# Patient Record
Sex: Female | Born: 1965 | Race: Black or African American | Hispanic: No | Marital: Married | Smoking: Current every day smoker
Health system: Southern US, Community
[De-identification: ages and names within clinical notes are randomized; demographics above are authoritative.]

## PROBLEM LIST (undated history)

## (undated) DIAGNOSIS — I10 Essential (primary) hypertension: Secondary | ICD-10-CM

---

## 2006-01-27 ENCOUNTER — Ambulatory Visit: Payer: Self-pay | Admitting: Unknown Physician Specialty

## 2006-05-14 ENCOUNTER — Emergency Department: Payer: Self-pay | Admitting: Emergency Medicine

## 2006-07-14 ENCOUNTER — Ambulatory Visit: Payer: Self-pay | Admitting: Unknown Physician Specialty

## 2006-07-26 ENCOUNTER — Inpatient Hospital Stay: Payer: Self-pay | Admitting: Unknown Physician Specialty

## 2006-08-03 ENCOUNTER — Inpatient Hospital Stay: Payer: Self-pay

## 2008-09-07 IMAGING — CT CT GUIDED ABCESS DRAINAGE WITH CATHETER
2 of 4 series · 13 of 32 positions shown, 18 images · non-contrast
Comparison: none

REASON FOR EXAM: Abscess
COMMENTS:   LMP: Post Hysterectomy

[Series 2: soft tissue · axial · 0.87mm/px · z∈[-1026,-874]mm · 7 of 27 slices shown, 12 images (1 of 2)]
[im 4/27  soft-tissue]
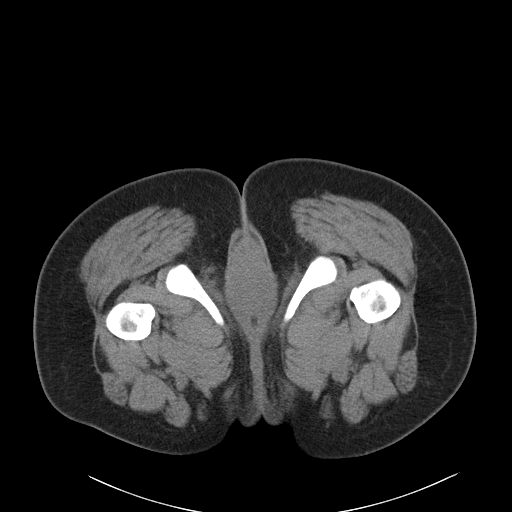
[im 4/27  bone]
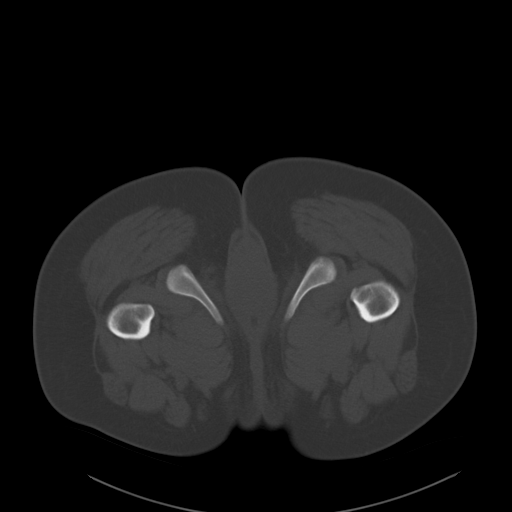
[im 7/27  soft-tissue]
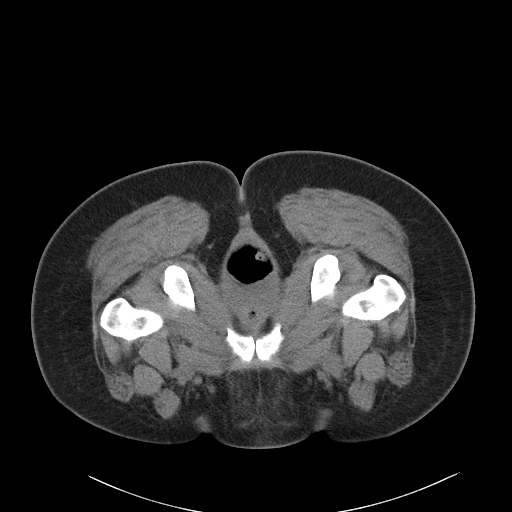
[im 10/27  soft-tissue]
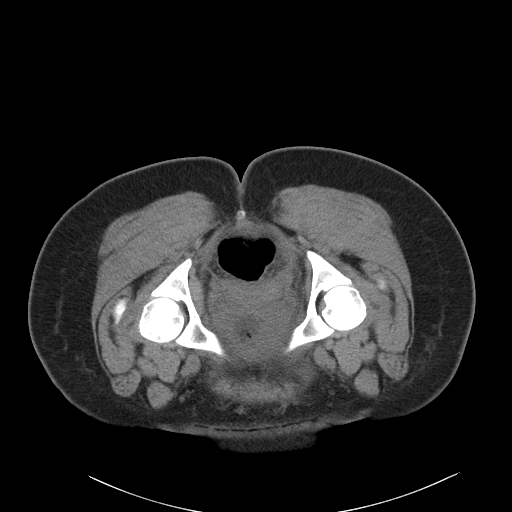
[im 14/27  soft-tissue]
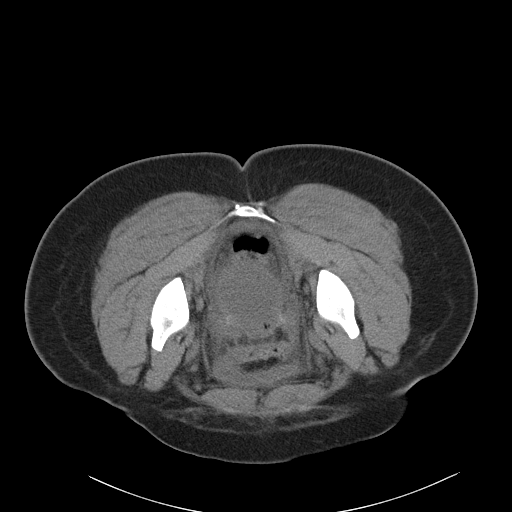
[im 14/27  lung]
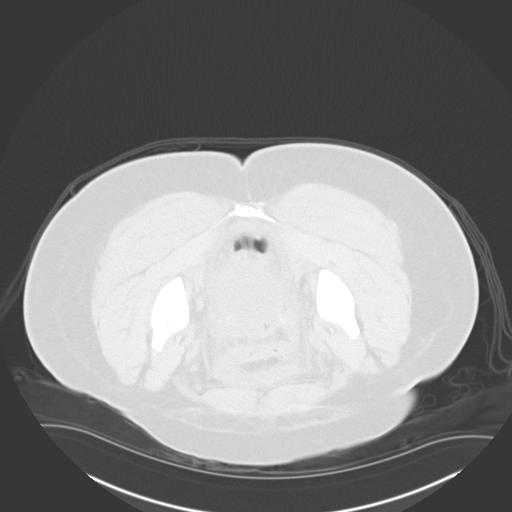
[im 17/27  soft-tissue]
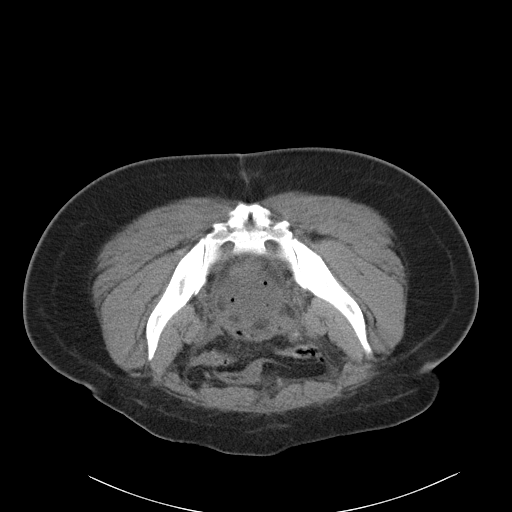
[im 17/27  lung]
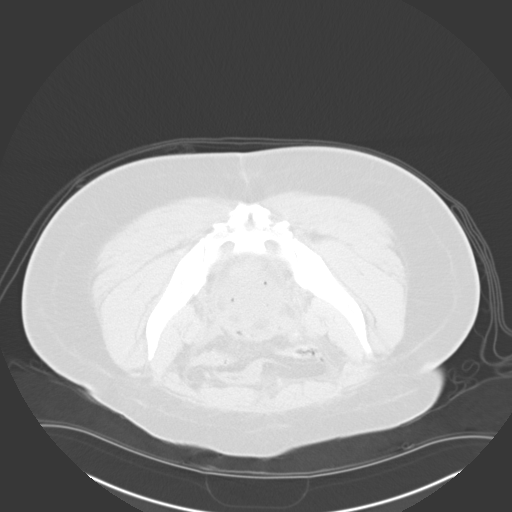
[im 20/27  soft-tissue]
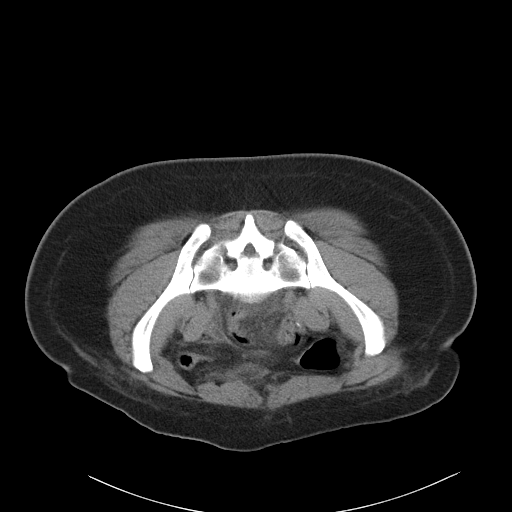
[im 20/27  lung]
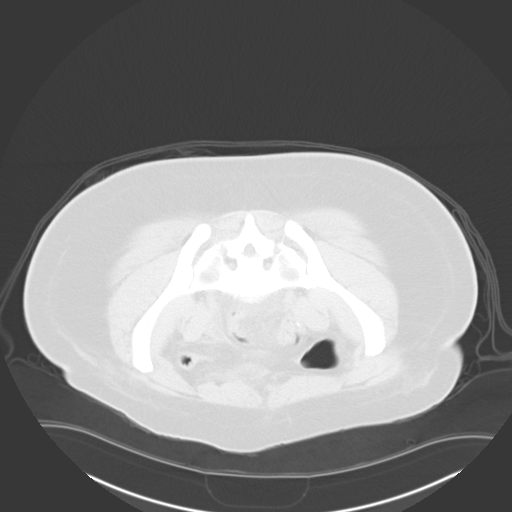
[im 23/27  soft-tissue]
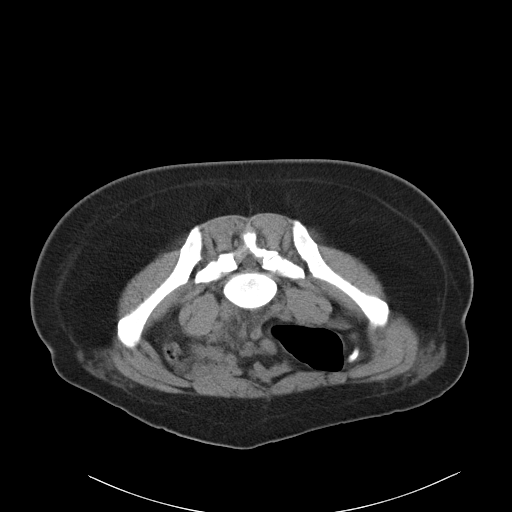
[im 23/27  lung]
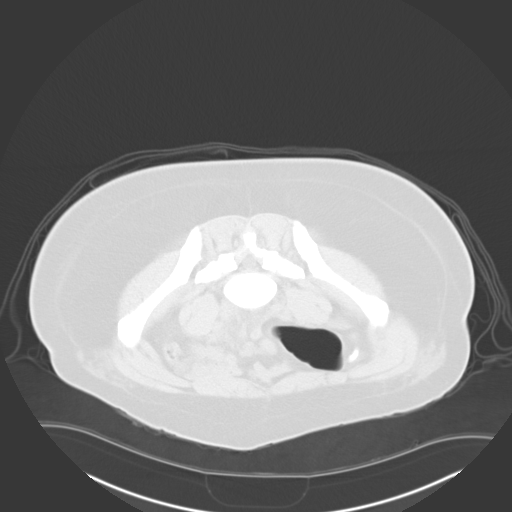

[Series 4: soft tissue · axial · 0.87mm/px · z∈[-1026,-874]mm · 6 of 27 slices shown (2 of 2)]
[im 4/27  soft-tissue]
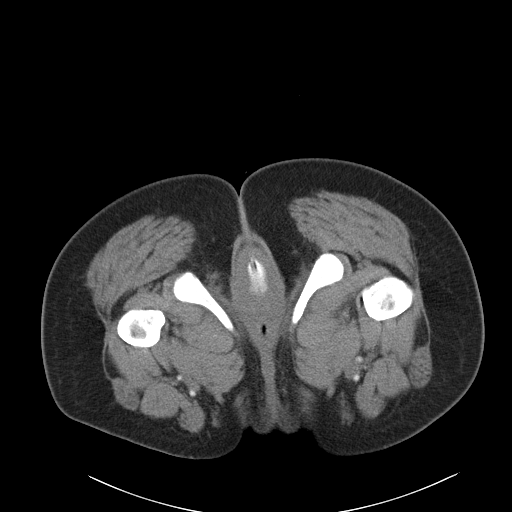
[im 8/27  soft-tissue]
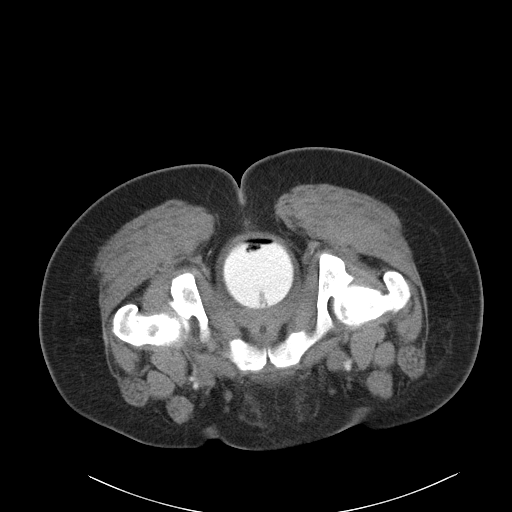
[im 12/27  soft-tissue]
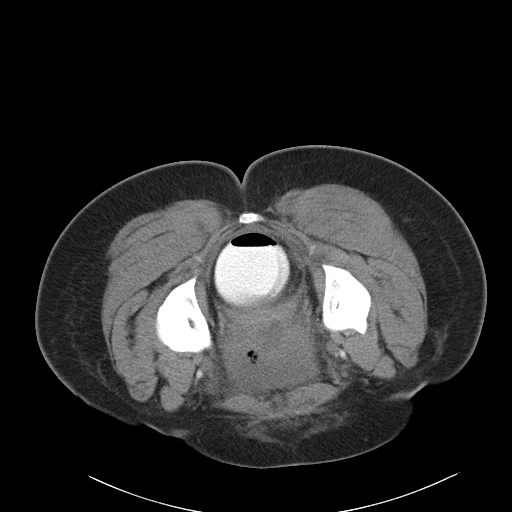
[im 15/27  soft-tissue]
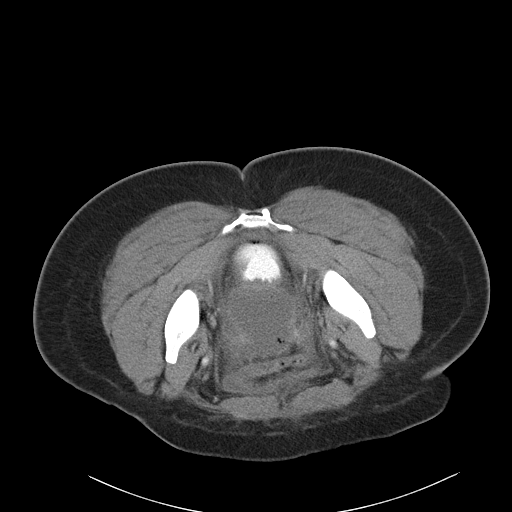
[im 19/27  soft-tissue]
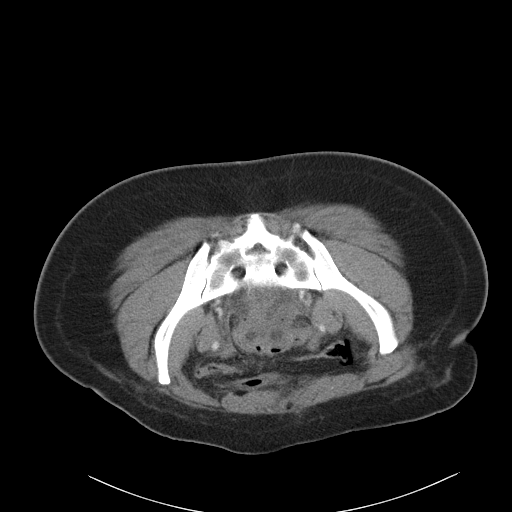
[im 23/27  soft-tissue]
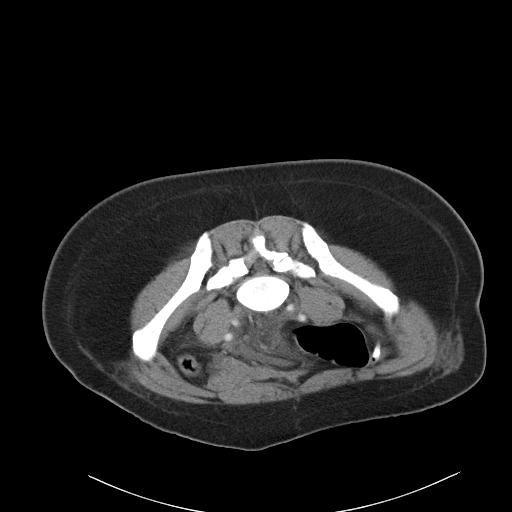

[13 of 32 positions shown; findings below may reference images not displayed]

PROCEDURE:     CT  - CT GUIDED ABSCESS DRAINAGE  - August 06, 2006  [DATE]

RESULT:     The patient was informed of the risks and benefits of the
procedure and proper informed consent was obtained. The patient was brought
to the CT suite and placed in a supine position. A fluid collection is
identified in the region of the central lower pelvis. Proper entry site for
CT guided cannulation and drainage was established along the LEFT gluteal
region. The overlying soft tissues were then prepped and draped in the usual
sterile fashion. Utilizing approximately 10 ml of 1% Lidocaine without
Epinephrine, the overlying soft tissues were anesthetized. The patient did
receive conscious sedation via titrated doses of 150 micrograms of Fentanyl
IV and 3 mg Versed IV. A 20.0 cm/20 gauge, Chiba Biopsy needle was used to
cannulate the fluid collection within the pelvis. Needle placement was
performed utilizing CT guidance. Once appropriate, needle placement was
performed and approximately 30 ml of bloody, serosanguineous fluid was
aspirated from the collection. These findings appear to reflect the sequelae
of an evolving/resolving hematoma. With this taken into consideration, the
findings were discussed with Dr. Brack Tiger of the OB/GYN Service and aspiration
without drainage tube placement appears to be a more prudent approach
considering the findings which appear to reflect a hematoma in evolution.
Once fluid could no longer be aspirated from the collection, the needle was
removed. Post aspiration evaluation demonstrated decreased size of the fluid
collection within the pelvis. The patient tolerated the procedure without
complications and was returned to previously administered orders per Dr.
Brack Tiger. A sample of the aspirated blood was sent to the laboratory for
culture and gram stain with results to be sent to Dr. Brack Tiger.
IMPRESSION: CT guided, fluid collection/aspiration as described above.
The patient tolerated the procedure without complications.

## 2010-07-23 ENCOUNTER — Ambulatory Visit: Payer: Self-pay | Admitting: Orthopedic Surgery

## 2010-07-27 ENCOUNTER — Emergency Department: Payer: Self-pay | Admitting: Emergency Medicine

## 2010-07-28 LAB — PATHOLOGY REPORT

## 2017-03-07 ENCOUNTER — Ambulatory Visit: Payer: Self-pay

## 2017-05-09 ENCOUNTER — Ambulatory Visit: Payer: Self-pay

## 2017-06-20 ENCOUNTER — Ambulatory Visit: Payer: Self-pay

## 2017-06-27 ENCOUNTER — Ambulatory Visit: Payer: Self-pay | Attending: Oncology

## 2017-12-31 ENCOUNTER — Emergency Department: Payer: Self-pay

## 2017-12-31 ENCOUNTER — Emergency Department
Admission: EM | Admit: 2017-12-31 | Discharge: 2017-12-31 | Disposition: A | Discharge status: Home / Self Care | Payer: Self-pay | Attending: Emergency Medicine | Admitting: Emergency Medicine

## 2017-12-31 ENCOUNTER — Encounter: Payer: Self-pay | Admitting: Emergency Medicine

## 2017-12-31 ENCOUNTER — Other Ambulatory Visit: Payer: Self-pay

## 2017-12-31 DIAGNOSIS — I10 Essential (primary) hypertension: Secondary | ICD-10-CM | POA: Insufficient documentation

## 2017-12-31 DIAGNOSIS — F17210 Nicotine dependence, cigarettes, uncomplicated: Secondary | ICD-10-CM | POA: Insufficient documentation

## 2017-12-31 DIAGNOSIS — J40 Bronchitis, not specified as acute or chronic: Secondary | ICD-10-CM | POA: Insufficient documentation

## 2017-12-31 HISTORY — DX: Essential (primary) hypertension: I10

## 2017-12-31 MED ORDER — BENZONATATE 100 MG PO CAPS: ORAL_CAPSULE | ORAL | 0 refills | Status: DC

## 2017-12-31 MED ORDER — IPRATROPIUM-ALBUTEROL 0.5-2.5 (3) MG/3ML IN SOLN
3.0000 mL | Freq: Once | RESPIRATORY_TRACT | Status: AC
  Administered 2017-12-31: 3 mL via RESPIRATORY_TRACT
  Filled 2017-12-31: qty 3

## 2017-12-31 MED ORDER — ALBUTEROL SULFATE HFA 108 (90 BASE) MCG/ACT IN AERS: 2.0000 | INHALATION_SPRAY | Freq: Four times a day (QID) | RESPIRATORY_TRACT | 0 refills | Status: DC | PRN

## 2017-12-31 MED ORDER — PREDNISONE 20 MG PO TABS
60.0000 mg | ORAL_TABLET | Freq: Once | ORAL | Status: AC
  Administered 2017-12-31: 60 mg via ORAL
  Filled 2017-12-31: qty 3

## 2017-12-31 MED ORDER — PREDNISONE 20 MG PO TABS: 20.0000 mg | ORAL_TABLET | Freq: Every day | ORAL | 0 refills | Status: AC

## 2017-12-31 NOTE — Discharge Instructions (Addendum)
Your exam and XR are negative for pneumonia. Take the prescription meds as directed. Drink plenty of fluids and rest as needed.

## 2017-12-31 NOTE — ED Notes (Signed)
Pt w/ complaints of cough that is productive with clear sputum. Provider at bedside and medication administered.

## 2017-12-31 NOTE — ED Notes (Signed)
Pt verbalized understanding of discharge instructions. NAD at this time. 

## 2017-12-31 NOTE — ED Triage Notes (Signed)
Pt to ed with c/o cough, congestion, sore throat x 2 weeks.

## 2018-01-01 NOTE — ED Provider Notes (Signed)
[facility] Emergency Department Provider Note ____________________________________________  Time seen: 1913  I have reviewed the triage vital signs and the nursing notes.  HISTORY  Chief Complaint  Cough  HPI Jessica Marsh is a 52 y.o. female who presents to the ED accompanied by her adult daughter, for evaluation of persistent cough and congestion for the last 2 weeks.  Patient was a current everyday smoker as well as hypertensive, reports that she has had a productive cough that has been worsened by exposure to cold air at work.  She has not taken any medications over-the-counter in the interim for her symptom relief.  She sustained excessive coughing today in the presence of her daughter, and is now presenting for further evaluation patient denies any chest pain, shortness of breath, wheezing, or syncope.  Past Medical History:  Diagnosis Date  . Hypertension     There are no active problems to display for this patient.   History reviewed. No pertinent surgical history.  Prior to Admission medications   Medication Sig Start Date End Date Taking? Authorizing Provider  albuterol (PROVENTIL HFA;VENTOLIN HFA) 108 (90 Base) MCG/ACT inhaler Inhale 2 puffs into the lungs every 6 (six) hours as needed for wheezing or shortness of breath. 12/31/17   Humphrey Guerreiro, Charlesetta IvoryJenise V Bacon, PA-C  benzonatate (TESSALON PERLES) 100 MG capsule Take 1-2 tabs TID prn cough 12/31/17   Jacub Waiters, Charlesetta IvoryJenise V Bacon, PA-C  predniSONE (DELTASONE) 20 MG tablet Take 1 tablet (20 mg total) by mouth daily with breakfast for 5 days. 12/31/17 01/05/18  Ahlayah Tarkowski, Charlesetta IvoryJenise V Bacon, PA-C    Allergies Patient has no known allergies.  History reviewed. No pertinent family history.  Social History Social History   Tobacco Use  . Smoking status: Current Every Day Smoker    Types: Cigarettes  . Smokeless tobacco: Never Used  Substance Use Topics  . Alcohol use: Never    Frequency: Never  . Drug use:  Never    Review of Systems  Constitutional: Negative for fever. Eyes: Negative for visual changes. ENT: Negative for sore throat. Cardiovascular: Negative for chest pain. Respiratory: Positive for shortness of breath. Gastrointestinal: Negative for abdominal pain, vomiting and diarrhea. Genitourinary: Negative for dysuria. Musculoskeletal: Negative for back pain. Skin: Negative for rash. Neurological: Negative for headaches, focal weakness or numbness. ____________________________________________  PHYSICAL EXAM:  VITAL SIGNS: ED Triage Vitals  Enc Vitals Group     BP 12/31/17 1835 (!) 186/78     Pulse Rate 12/31/17 1835 93     Resp 12/31/17 1835 18     Temp 12/31/17 1835 99.1 F (37.3 C)     Temp Source 12/31/17 1835 Oral     SpO2 12/31/17 1835 94 %     Weight 12/31/17 1837 181 lb (82.1 kg)     Height --      Head Circumference --      Peak Flow --      Pain Score 12/31/17 1836 0     Pain Loc --      Pain Edu? --      Excl. in GC? --     Constitutional: Alert and oriented. Well appearing and in no distress. Head: Normocephalic and atraumatic. Eyes: Conjunctivae are normal. Normal extraocular movements Ears: Canals clear. TMs intact bilaterally. Nose: No congestion/rhinorrhea/epistaxis. Mouth/Throat: Mucous membranes are moist.  Uvula is midline and tonsils are flat. Neck: Supple. No thyromegaly. Hematological/Lymphatic/Immunological: No cervical lymphadenopathy. Cardiovascular: Normal rate, regular rhythm. Normal distal pulses. Respiratory: Normal respiratory effort.  Moderate end expiratory wheezes appreciated. Moderate rhonchi noted bilaterally. Gastrointestinal: Soft and nontender. No distention. ____________________________________________   RADIOLOGY  CXR  IMPRESSION: No active cardiopulmonary disease. No evidence of pneumonia or pulmonary edema. ____________________________________________  PROCEDURES  Procedures DuoNeb x 2 Prednisone 60 mg  PO ____________________________________________  INITIAL IMPRESSION / ASSESSMENT AND PLAN / ED COURSE  Patient with ED evaluation of a 2-week complaint of persistent intermittent shortness of breath and bronchospasm.  Patient's chest x-ray confirms some chronic bronchitic changes but no acute infectious process.  Patient will be treated with prednisone and reports improvement in her symptoms after 2 DuoNeb's.  She is encouraged to follow-up with primary provider return to the ED as needed.  Prescriptions for Tessalon Perles, prednisone, and albuterol are provided.  Return precautions have been reviewed. ____________________________________________  FINAL CLINICAL IMPRESSION(S) / ED DIAGNOSES  Final diagnoses:  Bronchitis      Kinnick Maus, Charlesetta Ivory, PA-C 01/01/18 0031    Don Perking, [place], MD 01/01/18 [phone]

## 2020-02-02 IMAGING — CR DG CHEST 2V
1 series · 2 of 2 positions shown · non-contrast
Comparison: Chest x-ray dated 05/14/2006.

CLINICAL DATA: Cough, congestion, sore throat for 2 weeks.  Smoker.

EXAM:
CHEST - 2 VIEW

[Series 1: dg chest 2 view · 0.14mm/px · 2 of 2 slices shown]
[im 1/2]
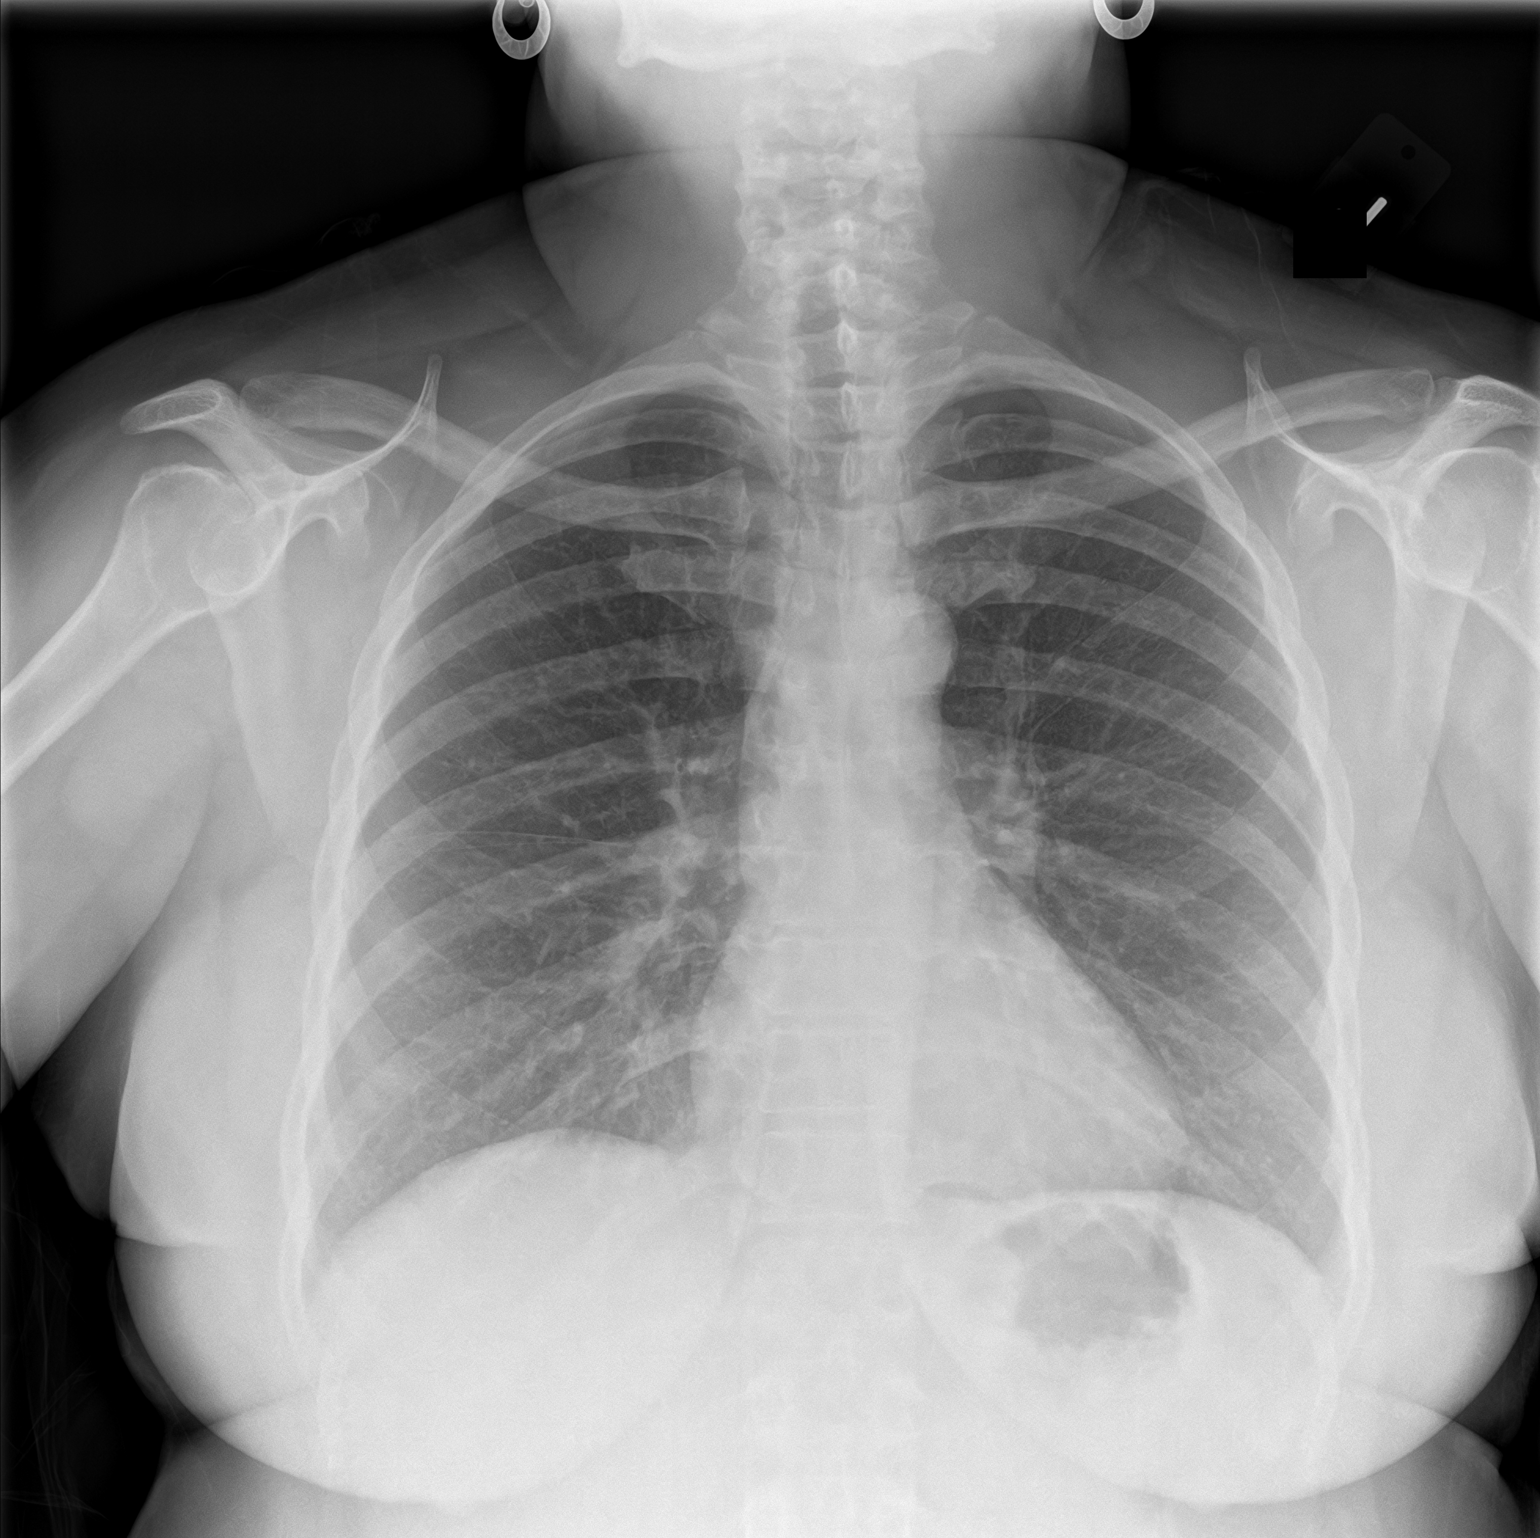
[im 2/2]
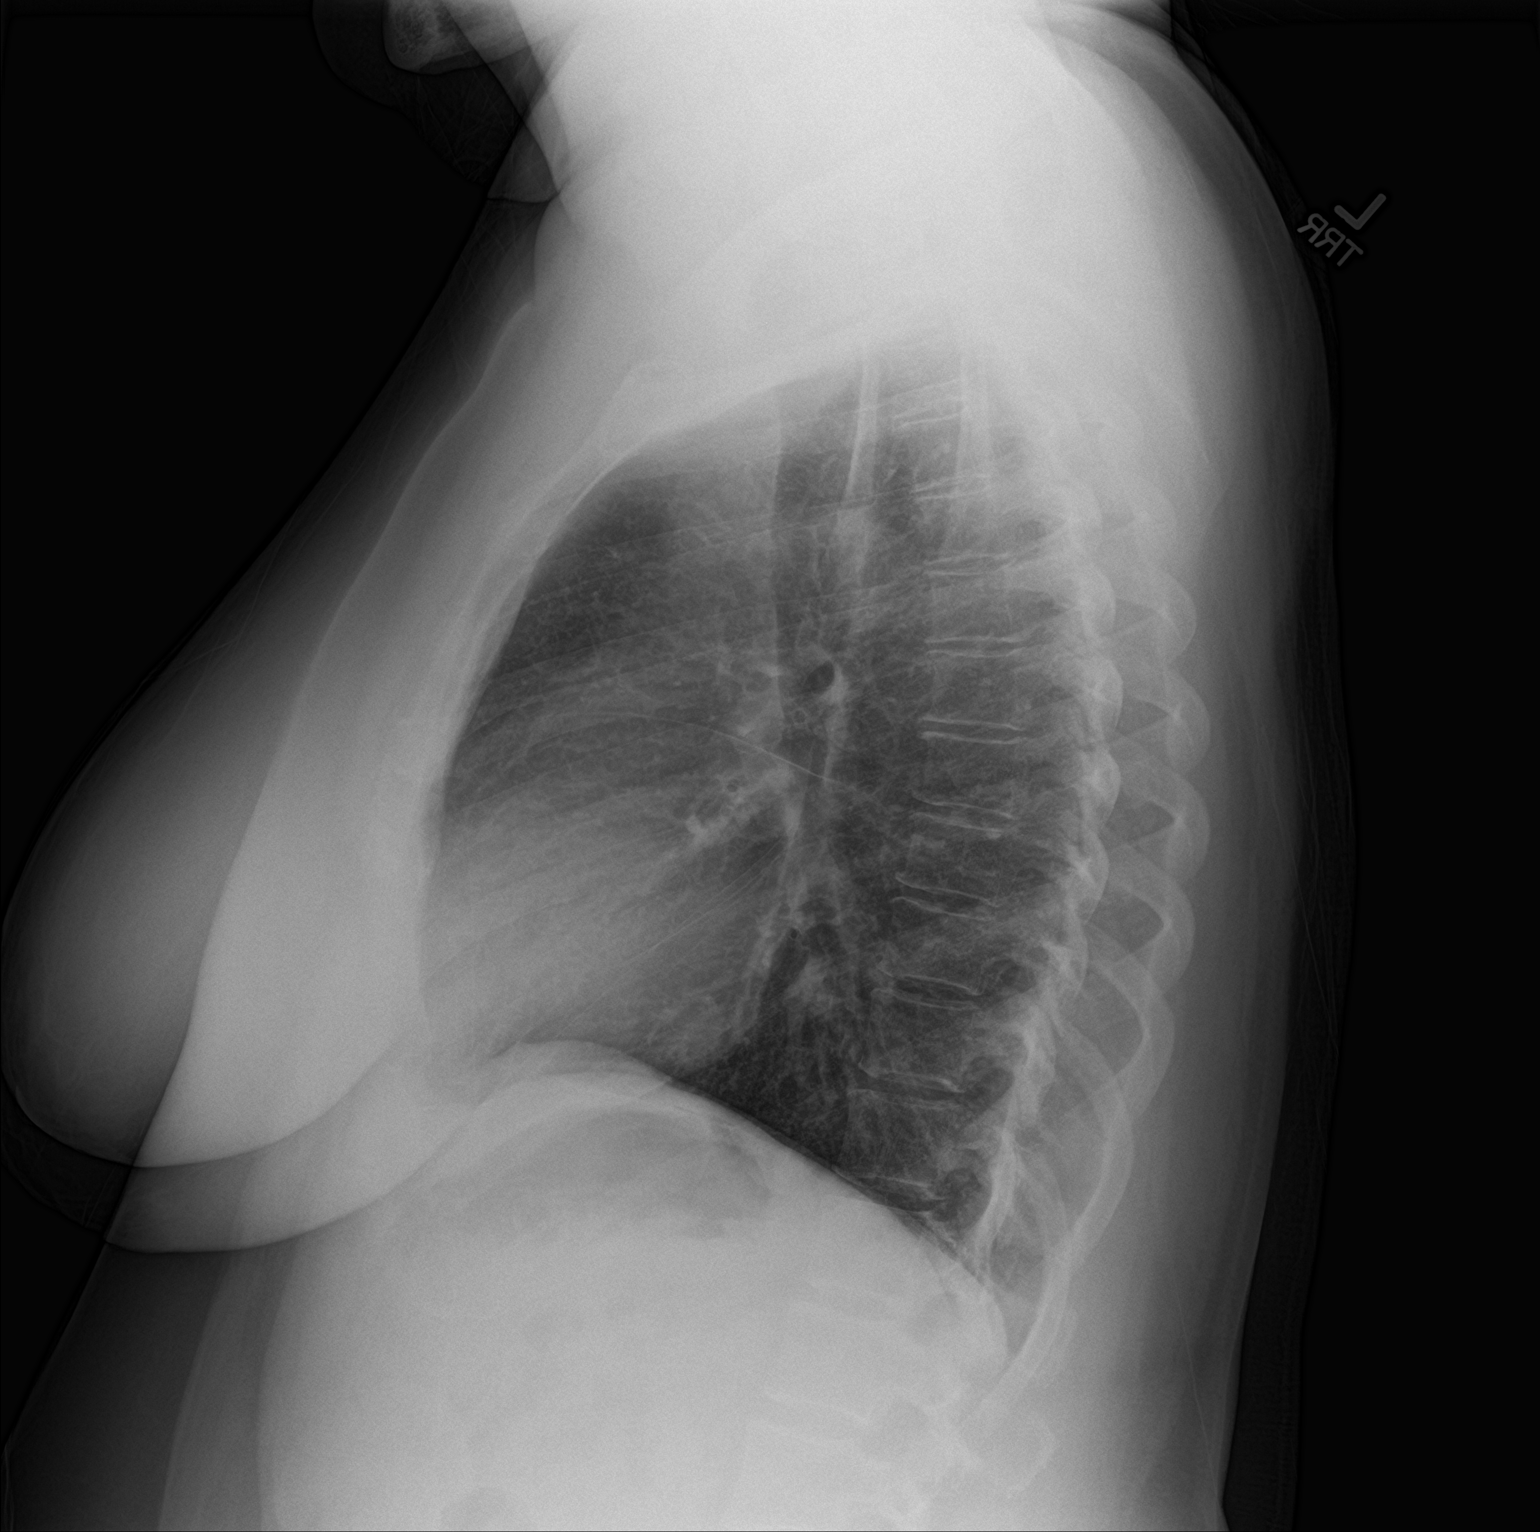

[2 of 2 positions shown; findings below may reference images not displayed]

FINDINGS: The heart size and mediastinal contours are within normal limits.
Both lungs are clear. The visualized skeletal structures are
unremarkable.
IMPRESSION: No active cardiopulmonary disease. No evidence of pneumonia or
pulmonary edema.

## 2021-01-22 ENCOUNTER — Other Ambulatory Visit: Payer: Self-pay | Admitting: Student

## 2021-01-22 DIAGNOSIS — R062 Wheezing: Secondary | ICD-10-CM

## 2021-03-05 ENCOUNTER — Other Ambulatory Visit: Payer: Self-pay

## 2021-03-05 ENCOUNTER — Other Ambulatory Visit
Admission: RE | Admit: 2021-03-05 | Discharge: 2021-03-05 | Disposition: A | Discharge status: Home / Self Care | Payer: Self-pay | Source: Ambulatory Visit | Attending: Pulmonary Disease | Admitting: Pulmonary Disease

## 2021-03-05 DIAGNOSIS — Z20822 Contact with and (suspected) exposure to covid-19: Secondary | ICD-10-CM | POA: Insufficient documentation

## 2021-03-05 DIAGNOSIS — Z01812 Encounter for preprocedural laboratory examination: Secondary | ICD-10-CM | POA: Insufficient documentation

## 2021-03-05 LAB — SARS CORONAVIRUS 2 (TAT 6-24 HRS): SARS Coronavirus 2: NEGATIVE

## 2021-03-06 ENCOUNTER — Ambulatory Visit: Payer: Self-pay | Attending: Student

## 2021-03-06 DIAGNOSIS — R062 Wheezing: Secondary | ICD-10-CM | POA: Insufficient documentation

## 2021-03-06 MED ORDER — ALBUTEROL SULFATE (2.5 MG/3ML) 0.083% IN NEBU
2.5000 mg | INHALATION_SOLUTION | Freq: Once | RESPIRATORY_TRACT | Status: AC
  Administered 2021-03-06: 2.5 mg via RESPIRATORY_TRACT
  Filled 2021-03-06: qty 3

## 2023-03-02 ENCOUNTER — Emergency Department
Admission: EM | Admit: 2023-03-02 | Discharge: 2023-03-02 | Disposition: A | Discharge status: Home / Self Care | Payer: 59 | Attending: Emergency Medicine | Admitting: Emergency Medicine

## 2023-03-02 ENCOUNTER — Emergency Department: Payer: Self-pay

## 2023-03-02 ENCOUNTER — Other Ambulatory Visit: Payer: Self-pay

## 2023-03-02 DIAGNOSIS — R079 Chest pain, unspecified: Secondary | ICD-10-CM | POA: Diagnosis not present

## 2023-03-02 DIAGNOSIS — Z20822 Contact with and (suspected) exposure to covid-19: Secondary | ICD-10-CM | POA: Insufficient documentation

## 2023-03-02 DIAGNOSIS — R059 Cough, unspecified: Secondary | ICD-10-CM | POA: Diagnosis not present

## 2023-03-02 DIAGNOSIS — J189 Pneumonia, unspecified organism: Secondary | ICD-10-CM

## 2023-03-02 DIAGNOSIS — J181 Lobar pneumonia, unspecified organism: Secondary | ICD-10-CM | POA: Diagnosis not present

## 2023-03-02 DIAGNOSIS — J9801 Acute bronchospasm: Secondary | ICD-10-CM | POA: Insufficient documentation

## 2023-03-02 DIAGNOSIS — I10 Essential (primary) hypertension: Secondary | ICD-10-CM | POA: Diagnosis not present

## 2023-03-02 DIAGNOSIS — J069 Acute upper respiratory infection, unspecified: Secondary | ICD-10-CM | POA: Insufficient documentation

## 2023-03-02 LAB — BASIC METABOLIC PANEL
Anion gap: 12 (ref 5–15)
BUN: 12 mg/dL (ref 6–20)
CO2: 25 mmol/L (ref 22–32)
Calcium: 9 mg/dL (ref 8.9–10.3)
Chloride: 102 mmol/L (ref 98–111)
Creatinine, Ser: 0.97 mg/dL (ref 0.44–1.00)
GFR, Estimated: >60 mL/min (ref >60)
Glucose, Bld: 97 mg/dL (ref 70–99)
Potassium: 4.2 mmol/L (ref 3.5–5.1)
Sodium: 139 mmol/L (ref 135–145)

## 2023-03-02 LAB — CBC
HCT: 42.8 % (ref 36.0–46.0)
Hemoglobin: 13.8 g/dL (ref 12.0–15.0)
MCH: 31.2 pg (ref 26.0–34.0)
MCHC: 32.2 g/dL (ref 30.0–36.0)
MCV: 96.6 fL (ref 80.0–100.0)
Platelets: 203 10*3/uL (ref 150–400)
RBC: 4.43 MIL/uL (ref 3.87–5.11)
RDW: 13 % (ref 11.5–15.5)
WBC: 13.5 10*3/uL — ABNORMAL HIGH (ref 4.0–10.5)
nRBC: 0 % (ref 0.0–0.2)

## 2023-03-02 LAB — RESP PANEL BY RT-PCR (RSV, FLU A&B, COVID)  RVPGX2
Influenza A by PCR: NEGATIVE
Influenza B by PCR: NEGATIVE
Resp Syncytial Virus by PCR: NEGATIVE
SARS Coronavirus 2 by RT PCR: NEGATIVE

## 2023-03-02 LAB — TROPONIN I (HIGH SENSITIVITY): Troponin I (High Sensitivity): 6 ng/L (ref <18)

## 2023-03-02 MED ORDER — CEPHALEXIN 500 MG PO CAPS
500.0000 mg | ORAL_CAPSULE | Freq: Once | ORAL | Status: AC
  Administered 2023-03-02: 500 mg via ORAL
  Filled 2023-03-02: qty 1

## 2023-03-02 MED ORDER — IPRATROPIUM-ALBUTEROL 0.5-2.5 (3) MG/3ML IN SOLN
3.0000 mL | Freq: Once | RESPIRATORY_TRACT | Status: AC
  Administered 2023-03-02: 3 mL via RESPIRATORY_TRACT
  Filled 2023-03-02: qty 3

## 2023-03-02 MED ORDER — AZITHROMYCIN 250 MG PO TABS: ORAL_TABLET | ORAL | 0 refills | Status: AC

## 2023-03-02 MED ORDER — ALBUTEROL SULFATE HFA 108 (90 BASE) MCG/ACT IN AERS: 2.0000 | INHALATION_SPRAY | Freq: Four times a day (QID) | RESPIRATORY_TRACT | 2 refills | Status: AC | PRN

## 2023-03-02 MED ORDER — AZITHROMYCIN 500 MG PO TABS
500.0000 mg | ORAL_TABLET | Freq: Once | ORAL | Status: AC
  Administered 2023-03-02: 500 mg via ORAL
  Filled 2023-03-02: qty 1

## 2023-03-02 MED ORDER — CEPHALEXIN 500 MG PO CAPS: 500.0000 mg | ORAL_CAPSULE | Freq: Three times a day (TID) | ORAL | 0 refills | Status: AC

## 2023-03-02 MED ORDER — PREDNISONE 50 MG PO TABS: 50.0000 mg | ORAL_TABLET | Freq: Every day | ORAL | 0 refills | Status: AC

## 2023-03-02 MED ORDER — PREDNISONE 20 MG PO TABS
60.0000 mg | ORAL_TABLET | Freq: Once | ORAL | Status: AC
  Administered 2023-03-02: 60 mg via ORAL
  Filled 2023-03-02: qty 3

## 2023-03-02 NOTE — ED Provider Triage Note (Signed)
Emergency Medicine Provider Triage Evaluation Note  Jessica Marsh , a 58 y.o. female  was evaluated in triage.  Pt complains of cough, congestion, chest pain with cough and deep breath x 1 week. No fever.   Physical Exam  BP 135/86 (BP Location: Left Arm)   Pulse 90   Temp 98.8 F (37.1 C) (Oral)   Resp 16   Ht 5' (1.524 m)   Wt 83 kg   SpO2 100%   BMI 35.74 kg/m  Gen:   Awake, no distress   Resp:  Normal effort  MSK:   Moves extremities without difficulty  Other:    Medical Decision Making  Medically screening exam initiated at 6:44 PM.  Appropriate orders placed.  Jessica Marsh was informed that the remainder of the evaluation will be completed by another provider, this initial triage assessment does not replace that evaluation, and the importance of remaining in the ED until their evaluation is complete.    Jessica Pester, FNP 03/02/23 1845

## 2023-03-12 NOTE — ED Provider Notes (Signed)
[facility] Provider Note    Event Date/Time   First MD Initiated Contact with Patient 03/02/23 2046     (approximate)   History   URI   HPI  Jessica Marsh is a 58 y.o. female with a history of hypertension presents with cough, congestion, some discomfort in the chest with cough for about 1 week.  No fevers reported.  No recent travel.  No calf pain or swelling     Physical Exam   Triage Vital Signs: ED Triage Vitals  Encounter Vitals Group     BP 03/02/23 1838 135/86     Systolic BP Percentile --      Diastolic BP Percentile --      Pulse Rate 03/02/23 1838 90     Resp 03/02/23 1838 16     Temp 03/02/23 1838 98.8 F (37.1 C)     Temp Source 03/02/23 1838 Oral     SpO2 03/02/23 1838 100 %     Weight 03/02/23 1840 83 kg (183 lb)     Height 03/02/23 1840 1.524 m (5')     Head Circumference --      Peak Flow --      Pain Score 03/02/23 1839 6     Pain Loc --      Pain Education --      Exclude from Growth Chart --     Most recent vital signs: Vitals:   03/02/23 1838  BP: 135/86  Pulse: 90  Resp: 16  Temp: 98.8 F (37.1 C)  SpO2: 100%     General: Awake, no distress.  CV:  Good peripheral perfusion.  Resp:  Normal effort.  Mild scattered wheezing Abd:  No distention.  Other:     ED Results / Procedures / Treatments   Labs (all labs ordered are listed, but only abnormal results are displayed) Labs Reviewed  CBC - Abnormal; Notable for the following components:      Result Value   WBC 13.5 (*)    All other components within normal limits  RESP PANEL BY RT-PCR (RSV, FLU A&B, COVID)  [id number]  BASIC METABOLIC PANEL  TROPONIN I (HIGH SENSITIVITY)     EKG  ED ECG REPORT I, Jene Every, the attending physician, personally viewed and interpreted this ECG.  Date: 03/12/2023  Rhythm: normal sinus rhythm QRS Axis: normal Intervals: normal ST/T Wave abnormalities: normal Narrative Interpretation: no evidence of acute  ischemia    RADIOLOGY Chest x-ray viewed interpret by me, suspicious for right-sided pneumonia    PROCEDURES:  Critical Care performed:   Procedures   MEDICATIONS ORDERED IN ED: Medications  ipratropium-albuterol (DUONEB) 0.5-2.5 (3) MG/3ML nebulizer solution 3 mL (3 mLs Nebulization Given 03/02/23 2143)  ipratropium-albuterol (DUONEB) 0.5-2.5 (3) MG/3ML nebulizer solution 3 mL (3 mLs Nebulization Given 03/02/23 2143)  predniSONE (DELTASONE) tablet 60 mg (60 mg Oral Given 03/02/23 2143)  cephALEXin (KEFLEX) capsule 500 mg (500 mg Oral Given 03/02/23 2142)  azithromycin (ZITHROMAX) tablet 500 mg (500 mg Oral Given 03/02/23 2142)     IMPRESSION / MDM / ASSESSMENT AND PLAN / ED COURSE  I reviewed the triage vital signs and the nursing notes. Patient's presentation is most consistent with acute illness / injury with system symptoms.  Patient presents with symptoms as above, suspicious for upper respiratory infection, viral versus pneumonia  Scattered wheezing on exam, will treat with DuoNebs for likely bronchospasm  Chest x-ray suggests early pneumonia, mildly elevated white blood cell also, PCR  viral panel is negative, lab work otherwise reassuring  Will treat with p.o. antibiotics, appropriate for outpatient workup, she is much better after DuoNebs        FINAL CLINICAL IMPRESSION(S) / ED DIAGNOSES   Final diagnoses:  Upper respiratory tract infection, unspecified type  Bronchospasm  Community acquired pneumonia of left lung, unspecified part of lung     Rx / DC Orders   ED Discharge Orders          Ordered    predniSONE (DELTASONE) 50 MG tablet  Daily with breakfast        03/02/23 2202    cephALEXin (KEFLEX) 500 MG capsule  3 times daily        03/02/23 2202    azithromycin (ZITHROMAX Z-PAK) 250 MG tablet        03/02/23 2202    albuterol (VENTOLIN HFA) 108 (90 Base) MCG/ACT inhaler  Every 6 hours PRN        03/02/23 2202             Note:  This  document was prepared using Dragon voice recognition software and may include unintentional dictation errors.   Jene Every, MD 03/12/23 [phone]
